# Patient Record
Sex: Male | Born: 1975 | Race: Black or African American | Hispanic: No | Marital: Married | Smoking: Current every day smoker
Health system: Southern US, Community
[De-identification: ages and names within clinical notes are randomized; demographics above are authoritative.]

---

## 2005-02-19 ENCOUNTER — Emergency Department (HOSPITAL_COMMUNITY): Admission: EM | Admit: 2005-02-19 | Discharge: 2005-02-19 | Payer: Self-pay | Admitting: Emergency Medicine

## 2006-07-08 ENCOUNTER — Emergency Department (HOSPITAL_COMMUNITY): Admission: EM | Admit: 2006-07-08 | Discharge: 2006-07-08 | Payer: Self-pay | Admitting: Emergency Medicine

## 2006-07-24 ENCOUNTER — Ambulatory Visit: Payer: Self-pay | Admitting: Internal Medicine

## 2006-07-24 ENCOUNTER — Inpatient Hospital Stay (HOSPITAL_COMMUNITY): Admission: EM | Admit: 2006-07-24 | Discharge: 2006-07-26 | Payer: Self-pay | Admitting: Emergency Medicine

## 2007-01-08 ENCOUNTER — Emergency Department (HOSPITAL_COMMUNITY): Admission: EM | Admit: 2007-01-08 | Discharge: 2007-01-08 | Payer: Self-pay | Admitting: Emergency Medicine

## 2007-07-24 ENCOUNTER — Emergency Department (HOSPITAL_COMMUNITY): Admission: EM | Admit: 2007-07-24 | Discharge: 2007-07-24 | Payer: Self-pay | Admitting: Emergency Medicine

## 2008-05-02 ENCOUNTER — Emergency Department (HOSPITAL_COMMUNITY): Admission: EM | Admit: 2008-05-02 | Discharge: 2008-05-03 | Payer: Self-pay | Admitting: Emergency Medicine

## 2010-01-18 ENCOUNTER — Emergency Department (HOSPITAL_COMMUNITY): Admission: EM | Admit: 2010-01-18 | Discharge: 2010-01-18 | Payer: Self-pay | Admitting: Emergency Medicine

## 2010-06-09 LAB — RPR: RPR Ser Ql: NONREACTIVE

## 2010-07-13 LAB — DIFFERENTIAL
Eosinophils Relative: 1 % (ref 0–5)
Lymphocytes Relative: 25 % (ref 12–46)
Lymphs Abs: 2.7 10*3/uL (ref 0.7–4.0)
Monocytes Absolute: 0.8 10*3/uL (ref 0.1–1.0)

## 2010-07-13 LAB — CBC
HCT: 46.8 % (ref 39.0–52.0)
Hemoglobin: 15.9 g/dL (ref 13.0–17.0)
WBC: 10.9 10*3/uL — ABNORMAL HIGH (ref 4.0–10.5)
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
## 2013-09-24 ENCOUNTER — Emergency Department: Payer: Self-pay | Admitting: Emergency Medicine

## 2014-10-04 ENCOUNTER — Emergency Department
Admission: EM | Admit: 2014-10-04 | Discharge: 2014-10-04 | Disposition: A | Discharge status: Home / Self Care | Payer: Self-pay | Attending: Emergency Medicine | Admitting: Emergency Medicine

## 2014-10-04 ENCOUNTER — Encounter: Payer: Self-pay | Admitting: Emergency Medicine

## 2014-10-04 DIAGNOSIS — K122 Cellulitis and abscess of mouth: Secondary | ICD-10-CM | POA: Insufficient documentation

## 2014-10-04 DIAGNOSIS — Z720 Tobacco use: Secondary | ICD-10-CM | POA: Insufficient documentation

## 2014-10-04 LAB — POCT RAPID STREP A: STREPTOCOCCUS, GROUP A SCREEN (DIRECT): NEGATIVE

## 2014-10-04 MED ORDER — AMOXICILLIN-POT CLAVULANATE 875-125 MG PO TABS
1.0000 | ORAL_TABLET | Freq: Once | ORAL | Status: AC
  Administered 2014-10-04: 1 via ORAL

## 2014-10-04 MED ORDER — AMOXICILLIN-POT CLAVULANATE 875-125 MG PO TABS: 1.0000 | ORAL_TABLET | Freq: Two times a day (BID) | ORAL | Status: AC

## 2014-10-04 MED ORDER — DEXAMETHASONE 4 MG PO TABS
ORAL_TABLET | ORAL | Status: AC
Start: 2014-10-04 — End: 2014-10-04
  Administered 2014-10-04: 8 mg via ORAL
  Filled 2014-10-04: qty 2

## 2014-10-04 MED ORDER — DEXAMETHASONE 4 MG PO TABS
8.0000 mg | ORAL_TABLET | Freq: Once | ORAL | Status: AC
  Administered 2014-10-04: 8 mg via ORAL

## 2014-10-04 MED ORDER — AMOXICILLIN-POT CLAVULANATE 875-125 MG PO TABS
ORAL_TABLET | ORAL | Status: AC
  Administered 2014-10-04: 1 via ORAL
  Filled 2014-10-04: qty 1

## 2014-10-04 NOTE — ED Notes (Signed)
Report to bill, rn.  

## 2014-10-04 NOTE — Discharge Instructions (Signed)
Uvulitis  Uvulitis is redness and soreness (inflammation) of the uvula. The uvula is the small tongue-shaped piece of tissue in the back of your mouth.   CAUSES  Infection is a common cause of uvulitis. Infection of the uvula can be either viral or bacterial. Infectious uvulitis usually only occurs in association with another condition, such as inflammation and infection of the mouth or throat.   Other causes of uvulitis include:  · Trauma to the uvula.  · Swelling from excess fluid buildup (edema), which may be an allergic reaction.  · Inhalation of irritants, such as chemical agents, smoke, or steam.  DIAGNOSIS  Your caregiver can usually diagnose uvulitis through a physical examination. Bacterial uvulitis can be diagnosed through the results of the growth of samples of bodily substances taken from your mouth (cultures).  HOME CARE INSTRUCTIONS   · Rest as much as possible.  · Young children may suck on frozen juice bars or frozen ice pops. Older children and adults may gargle with a warm or cold liquid to help soothe the throat. (Mix ¼ tsp of salt in 8 oz of water, or use strong tea.)  · Use a cool-mist humidifier to lessen throat irritation and cough.  · Drink enough fluids to keep your urine clear or pale yellow.  · While the throat is very sore, eat soft or liquid foods such as milk, ice cream, soups, or milk drinks.  · Family members who develop a sore throat or fever should have a medical exam or throat culture.  · If your child has uvulitis and is taking antibiotic medicine, wait 24 hours or until his or her temperature is near normal (less than 100° F [37.8° C]) before allowing him or her to return to school or day care.  · Only take over-the-counter or prescription medicines for pain, discomfort, or fever as directed by your caregiver.  Ask when your test results will be ready. Make sure you get your test results.   SEEK MEDICAL CARE IF:   · You have an oral temperature above 102° F (38.9° C).  · You  develop large, tender lumps your the neck.  · Your child develops a rash.  · You cough up Mcaulay, yellow-brown, or bloody substances.  SEEK IMMEDIATE MEDICAL CARE IF:   · You develop any new symptoms, such as vomiting, earache, severe headache, stiff neck, chest pain, or trouble breathing or swallowing.  · Your airway is blocked.  · You develop more severe throat pain along with drooling or voice changes.  Document Released: 10/23/2003 Document Revised: 06/06/2011 Document Reviewed: 05/20/2010  Exit[employer]® Patient Information ©2015 [employer]. This information is not intended to replace advice given to you by your health care provider. Make sure you discuss any questions you have with your health care provider.

## 2014-10-04 NOTE — ED Notes (Signed)
Patient states that he woke up with a sore throat this morning.

## 2014-10-04 NOTE — ED Provider Notes (Signed)
[facility] Emergency Department Provider Note  Time seen: 7:10 AM  I have reviewed the triage vital signs and the nursing notes.   HISTORY  Chief Complaint Sore Throat    HPI Thomas Becker is a 39 y.o. male with no past medical history who presents the emergency department with a sore throat. According to the patient his wife states he was snoring very hard last night. The patient awoke this morning with a very sore throat with pain when swallowing. Denies fever, denies shortness of breath. Patient is able to swallow secretions but with pain. Describes his sore throat is moderate in severity worse when swallowing.     No past medical history on file.  There are no active problems to display for this patient.   No past surgical history on file.  No current outpatient prescriptions on file.  Allergies Review of patient's allergies indicates no known allergies.  History reviewed. No pertinent family history.  Social History History  Substance Use Topics  . Smoking status: Current Every Day Smoker -- 0.50 packs/day for 22 years    Types: Cigarettes  . Smokeless tobacco: Not on file  . Alcohol Use: Yes     Comment: occasionly    Review of Systems Constitutional: Negative for fever. Cardiovascular: Negative for chest pain. ENT:  Sore throat Respiratory: Negative for shortness of breath. Gastrointestinal: Negative for abdominal pain Genitourinary: Negative for dysuria. Musculoskeletal: Negative for back pain.  10-point ROS otherwise negative.  ____________________________________________   PHYSICAL EXAM:  VITAL SIGNS: ED Triage Vitals  Enc Vitals Group     BP 10/04/14 0631 132/96 mmHg     Pulse Rate 10/04/14 0631 90     Resp --      Temp 10/04/14 0631 97.8 F (36.6 C)     Temp Source 10/04/14 0631 Oral     SpO2 10/04/14 0631 96 %     Weight 10/04/14 0631 283 lb (128.368 kg)     Height 10/04/14 0631 5\' 9"  (1.753 m)     Head Cir  --      Peak Flow --      Pain Score 10/04/14 0634 9     Pain Loc --      Pain Edu? --      Excl. in GC? --     Constitutional: Alert and oriented. Well appearing and in no distress. Eyes: Normal exam ENT   Mouth/Throat: Mucous membranes are moist. Moderate swelling of the uvula with erythema of the uvula and pharynx, no exudates no unilateral swelling of the tonsils. Cardiovascular: Normal rate, regular rhythm. No murmur Respiratory: Normal respiratory effort without tachypnea nor retractions. Breath sounds are clear and equal bilaterally. No wheezes/rales/rhonchi. Gastrointestinal: Soft and nontender. No distention.  Musculoskeletal: Nontender with normal range of motion in all extremities.  Neurologic:  Normal speech and language. No gross focal neurologic deficits  Skin:  Skin is warm, dry and intact.  Psychiatric: Mood and affect are normal. Speech and behavior are normal.   ____________________________________________   INITIAL IMPRESSION / ASSESSMENT AND PLAN / ED COURSE  Pertinent labs & imaging results that were available during my care of the patient were reviewed by me and considered in my medical decision making (see chart for details).  A with exam consistent with uvulitis. We'll place the patient on Decadron 1, augmentin 7 days. I discussed with the patient use over-the-counter Chloraseptic, and Motrin as needed for discomfort. I discussed strict return precautions with the patient which she  is agreeable. We will discharge the patient home with PCP follow up.  ____________________________________________   FINAL CLINICAL IMPRESSION(S) / ED DIAGNOSES  Uvulitis   Minna Antis, MD 10/04/14 [id number]

## 2014-10-06 LAB — CULTURE, GROUP A STREP (THRC)

## 2016-12-31 ENCOUNTER — Emergency Department
Admission: EM | Admit: 2016-12-31 | Discharge: 2016-12-31 | Disposition: A | Discharge status: Home / Self Care | Payer: No Typology Code available for payment source | Attending: Emergency Medicine | Admitting: Emergency Medicine

## 2016-12-31 ENCOUNTER — Encounter: Payer: Self-pay | Admitting: Emergency Medicine

## 2016-12-31 ENCOUNTER — Emergency Department: Payer: No Typology Code available for payment source

## 2016-12-31 DIAGNOSIS — Y92411 Interstate highway as the place of occurrence of the external cause: Secondary | ICD-10-CM | POA: Insufficient documentation

## 2016-12-31 DIAGNOSIS — Y999 Unspecified external cause status: Secondary | ICD-10-CM | POA: Insufficient documentation

## 2016-12-31 DIAGNOSIS — F17210 Nicotine dependence, cigarettes, uncomplicated: Secondary | ICD-10-CM | POA: Diagnosis not present

## 2016-12-31 DIAGNOSIS — Y9389 Activity, other specified: Secondary | ICD-10-CM | POA: Insufficient documentation

## 2016-12-31 DIAGNOSIS — M542 Cervicalgia: Secondary | ICD-10-CM | POA: Insufficient documentation

## 2016-12-31 DIAGNOSIS — M549 Dorsalgia, unspecified: Secondary | ICD-10-CM | POA: Diagnosis not present

## 2016-12-31 MED ORDER — CYCLOBENZAPRINE HCL 10 MG PO TABS: 10.0000 mg | ORAL_TABLET | Freq: Three times a day (TID) | ORAL | 0 refills | Status: AC | PRN

## 2016-12-31 MED ORDER — KETOROLAC TROMETHAMINE 30 MG/ML IJ SOLN
30.0000 mg | Freq: Once | INTRAMUSCULAR | Status: AC
  Administered 2016-12-31: 30 mg via INTRAMUSCULAR
  Filled 2016-12-31: qty 1

## 2016-12-31 MED ORDER — MELOXICAM 7.5 MG PO TABS: 7.5000 mg | ORAL_TABLET | Freq: Every day | ORAL | 1 refills | Status: AC
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
## 2019-08-01 ENCOUNTER — Other Ambulatory Visit: Payer: Self-pay

## 2019-08-01 ENCOUNTER — Emergency Department
Admission: EM | Admit: 2019-08-01 | Discharge: 2019-08-01 | Disposition: A | Discharge status: Home / Self Care | Payer: Self-pay | Attending: Emergency Medicine | Admitting: Emergency Medicine

## 2019-08-01 ENCOUNTER — Encounter: Payer: Self-pay | Admitting: Emergency Medicine

## 2019-08-01 DIAGNOSIS — F17210 Nicotine dependence, cigarettes, uncomplicated: Secondary | ICD-10-CM | POA: Insufficient documentation

## 2019-08-01 DIAGNOSIS — K047 Periapical abscess without sinus: Secondary | ICD-10-CM | POA: Insufficient documentation

## 2019-08-01 MED ORDER — AMOXICILLIN 500 MG PO CAPS: 500.0000 mg | ORAL_CAPSULE | Freq: Three times a day (TID) | ORAL | 0 refills | Status: AC

## 2019-08-01 MED ORDER — LIDOCAINE VISCOUS HCL 2 % MT SOLN: 10.0000 mL | OROMUCOSAL | 0 refills | Status: AC | PRN

## 2019-08-01 NOTE — Discharge Instructions (Signed)
OPTIONS FOR DENTAL FOLLOW UP CARE ° °[place] [facility] and Human Services - Local Safety Net Dental Clinics °http://www.ncdhhs.gov/dph/oralhealth/services/safetynetclinics.htm °  °[facility] (336-562-3123) ° °[facility] [place] ([phone]) ° °[facility] ([phone] ext [phone]) ° °[facility] ([phone]) ° °[facility] ([phone]) °This clinic caters to the indigent population and is on a lottery system. °Location: °[facility], [address], [place] °Clinic Hours: °Wednesdays from 6pm - 9pm, patients seen by a lottery system. °For dates, call or go to www.med.[facility].edu/[facility]/patients/Dental-[facility] °Services: °Cleanings, fillings and simple extractions. °Payment Options: °DENTAL WORK IS FREE OF CHARGE. Bring proof of income or support. °Best way to get seen: °Arrive at 5:15 pm - this is a lottery, NOT first come/first serve, so arriving earlier will not increase your chances of being seen. °  °  °[facility] °[phone] °Select option 1 for emergencies °  °Location: °[facility], [address], [place] °Clinic Hours: °No walk-ins accepted - call the day before to schedule an appointment. °Check in times are 9:30 am and 1:30 pm. °Services: °Simple extractions, temporary fillings, pulpectomy/pulp debridement, uncomplicated abscess drainage. °Payment Options: °PAYMENT IS DUE AT THE TIME OF SERVICE.  Fee is usually $100-200, additional surgical procedures (e.g. abscess drainage) may be extra. °Cash, checks, Visa/MasterCard accepted.  Can file Medicaid if patient is covered for dental - patient should call case worker to check. °No discount for [facility] patients. °Best way to get seen: °MUST call the day before and get onto the schedule. Can usually be seen the next 1-2 days. No walk-ins accepted. °  °  °[facility] °[phone] °   °Location: °[facility], [address], [place] °Clinic Hours: °M, W, Th, F 8am or 1:30pm, Tues 9a or 1:30 - first come/first served. °Services: °Simple extractions, temporary fillings, uncomplicated abscess drainage.  You do not need to be an [place] resident. °Payment Options: °PAYMENT IS DUE AT THE TIME OF SERVICE. °Dental insurance, otherwise sliding scale - bring proof of income or support. °Depending on income and treatment needed, cost is usually $50-200. °Best way to get seen: °Arrive early as it is first come/first served. °  °  °[facility] °[phone] °  °Location: °[address] °Clinic Hours: °Mon-Thu 8a-5p °Services: °Most basic dental services including extractions and fillings. °Payment Options: °PAYMENT IS DUE AT THE TIME OF SERVICE. °Sliding scale, up to 50% off - bring proof if income or support. °Medicaid with dental option accepted. °Best way to get seen: °Call to schedule an appointment, can usually be seen within 2 weeks OR they will try to see walk-ins - show up at 8a or 2p (you may have to wait). °  °  °[facility] °[phone] °[place] RESIDENTS ONLY °  °Location: °[facility], [address], [place], [place] [address] °Clinic Hours: By appointment only. °Monday - Thursday 8am-5pm, Friday 8am-12pm °Services: Cleanings, fillings, extractions. °Payment Options: °PAYMENT IS DUE AT THE TIME OF SERVICE. °Cash, Visa or MasterCard. Sliding scale - $30 minimum per service. °Best way to get seen: °Come in to office, complete packet and make an appointment - need proof of income °or support monies for each household member and proof of [place] residence. °Usually takes about a month to get in. °  °  °[facility] °[phone] °  °Location: °[address]   [place] °Clinic Hours: Walk-in Urgent Care Dental Services are offered Monday-Friday  mornings only. °The numbers of emergencies accepted daily is limited to the number of °providers available. °Maximum 15 - Mondays, Wednesdays & Thursdays °Maximum 10 - Tuesdays & Fridays °Services: °You do not need to be a [place] resident to be seen for a dental emergency. °Emergencies are defined as pain, swelling, abnormal bleeding, or dental trauma. Walkins will receive x-rays if needed. °NOTE: Dental cleaning is not an emergency. °Payment Options: °PAYMENT IS DUE AT THE TIME OF SERVICE. °Minimum co-pay is $40.00 for uninsured patients. °Minimum co-pay is $3.00 for Medicaid with dental coverage. °Dental Insurance is accepted and must be presented at time of visit. °Medicare does not cover dental. °Forms of payment: Cash, credit card, checks. °Best way to get seen: °If not previously registered with the clinic, walk-in dental registration begins at 7:15 am and is on a first come/first serve basis. °If previously registered with the clinic, call to make an appointment. °  °  °[facility] °[phone] °[place] RESIDENTS ONLY °  °Location: °[address], [place], [place] °Clinic Hours: °Mon-Thu 10a-2p °Services: Extractions only! °Payment Options: °FREE (donations accepted) - bring proof of income or support °Best way to get seen: °Call and schedule an appointment OR come at 8am on the 1st Monday of every month (except for holidays) when it is first come/first served. °  °  °[facility] °[phone] °  °Location: °[address], [place] °Clinic Hours: °Friday mornings °Services, Payment Options, Best way to get seen: °Call for info °

## 2019-08-01 NOTE — ED Triage Notes (Signed)
Pt reports toothache to left lower jaw for the last month getting worse.

## 2019-08-01 NOTE — ED Notes (Signed)
See triage note  Presents with possible dental abscess.. noticed left lower gum swelling about 1 month ago  States he has tried to drain area himself    Now having increased pain

## 2019-08-01 NOTE — ED Provider Notes (Signed)
[facility] Emergency Department Provider Note  ____________________________________________  Time seen: Approximately 9:59 AM  I have reviewed the triage vital signs and the nursing notes.   HISTORY  Chief Complaint Dental Pain    HPI Thomas Becker is a 44 y.o. male that presents to the emergency department for evaluation of left bottom dental pain for 1 month.  Patient states that he has developed an abscess.  It is draining and he is spitting out "salty drainage." He does not have a dentist.  He is unsure of fever.   History reviewed. No pertinent past medical history.  There are no problems to display for this patient.   History reviewed. No pertinent surgical history.  Prior to Admission medications   Medication Sig Start Date End Date Taking? Authorizing Provider  amoxicillin (AMOXIL) 500 MG capsule Take 1 capsule (500 mg total) by mouth 3 (three) times daily. 08/01/19   Enid Derry, PA-C  lidocaine (XYLOCAINE) 2 % solution Use as directed 10 mLs in the mouth or throat as needed. 08/01/19   Enid Derry, PA-C    Allergies Patient has no known allergies.  No family history on file.  Social History Social History   Tobacco Use  . Smoking status: Current Every Day Smoker    Packs/day: 0.50    Years: 22.00    Pack years: 11.00    Types: Cigarettes  Substance Use Topics  . Alcohol use: Yes    Comment: occasionly  . Drug use: No     Review of Systems  Constitutional: No fever/chills Respiratory: No SOB. Gastrointestinal: No nausea, no vomiting.  Musculoskeletal: Negative for musculoskeletal pain. Skin: Negative for rash, abrasions, lacerations, ecchymosis. Neurological: Negative for headaches   ____________________________________________   PHYSICAL EXAM:  VITAL SIGNS: ED Triage Vitals  Enc Vitals Group     BP 08/01/19 0904 (!) 169/122     Pulse Rate 08/01/19 0904 70     Resp 08/01/19 0904 18     Temp 08/01/19 0904 97.8  F (36.6 C)     Temp Source 08/01/19 0904 Oral     SpO2 08/01/19 0904 98 %     Weight 08/01/19 0859 275 lb (124.7 kg)     Height 08/01/19 0859 6\' 3"  (1.905 m)     Head Circumference --      Peak Flow --      Pain Score 08/01/19 0859 10     Pain Loc --      Pain Edu? --      Excl. in GC? --      Constitutional: Alert and oriented. Well appearing and in no acute distress. Eyes: Conjunctivae are normal. PERRL. EOMI. Head: Atraumatic. ENT:      Ears:      Nose: No congestion/rhinnorhea.  Swelling to left bottom gum with active drainage.  Able to open and close mouth without difficulty.  Full range of motion of jaw.      Mouth/Throat: Mucous membranes are moist.  Neck: No stridor.  Cardiovascular: Normal rate, regular rhythm.  Good peripheral circulation. Respiratory: Normal respiratory effort without tachypnea or retractions. Lungs CTAB. Good air entry to the bases with no decreased or absent breath sounds. Musculoskeletal: Full range of motion to all extremities. No gross deformities appreciated. Neurologic:  Normal speech and language. No gross focal neurologic deficits are appreciated.  Skin:  Skin is warm, dry and intact. No rash noted. Psychiatric: Mood and affect are normal. Speech and behavior are normal. Patient exhibits appropriate  insight and judgement.   ____________________________________________   LABS (all labs ordered are listed, but only abnormal results are displayed)  Labs Reviewed - No data to display ____________________________________________  EKG   ____________________________________________  RADIOLOGY  No results found.  ____________________________________________    PROCEDURES  Procedure(s) performed:    Procedures    Medications - No data to display   ____________________________________________   INITIAL IMPRESSION / ASSESSMENT AND PLAN / ED COURSE  Pertinent labs & imaging results that were available during my care of the  patient were reviewed by me and considered in my medical decision making (see chart for details).  Review of the [place] CSRS was performed in accordance of the [place] prior to dispensing any controlled drugs.   Patient's diagnosis is consistent with dental abscess. Patient will be discharged home with prescriptions for amoxicillin and viscous lidocaine. Patient is to follow up with dentist as directed.  Dental resources were given.  Patient is given ED precautions to return to the ED for any worsening or new symptoms.   Thomas Becker was evaluated in Emergency Department on 08/01/2019 for the symptoms described in the history of present illness. He was evaluated in the context of the global COVID-19 pandemic, which necessitated consideration that the patient might be at risk for infection with the SARS-CoV-2 virus that causes COVID-19. Institutional protocols and algorithms that pertain to the evaluation of patients at risk for COVID-19 are in a state of rapid change based on information released by regulatory bodies including the CDC and federal and state organizations. These policies and algorithms were followed during the patient's care in the ED.  ____________________________________________  FINAL CLINICAL IMPRESSION(S) / ED DIAGNOSES  Final diagnoses:  Dental abscess      NEW MEDICATIONS STARTED DURING THIS VISIT:  ED Discharge Orders         Ordered    amoxicillin (AMOXIL) 500 MG capsule  3 times daily     08/01/19 1034    lidocaine (XYLOCAINE) 2 % solution  As needed     08/01/19 1034              This chart was dictated using voice recognition software/Dragon. Despite best efforts to proofread, errors can occur which can change the meaning. Any change was purely unintentional.    Laban Emperor, PA-C 08/01/19 1242    Lavonia Drafts, MD 08/01/19 1252

## 2019-08-17 ENCOUNTER — Other Ambulatory Visit: Payer: Self-pay

## 2019-08-17 ENCOUNTER — Emergency Department
Admission: EM | Admit: 2019-08-17 | Discharge: 2019-08-17 | Disposition: A | Discharge status: Home / Self Care | Payer: Self-pay | Attending: Emergency Medicine | Admitting: Emergency Medicine

## 2019-08-17 DIAGNOSIS — F17210 Nicotine dependence, cigarettes, uncomplicated: Secondary | ICD-10-CM | POA: Insufficient documentation

## 2019-08-17 DIAGNOSIS — K047 Periapical abscess without sinus: Secondary | ICD-10-CM | POA: Insufficient documentation

## 2019-08-17 MED ORDER — TRAMADOL HCL 50 MG PO TABS: 50.0000 mg | ORAL_TABLET | Freq: Four times a day (QID) | ORAL | 0 refills | Status: DC | PRN

## 2019-08-17 MED ORDER — LIDOCAINE-EPINEPHRINE 2 %-1:100000 IJ SOLN
1.7000 mL | Freq: Once | INTRAMUSCULAR | Status: DC
  Filled 2019-08-17: qty 1.7

## 2019-08-17 MED ORDER — HYDROCODONE-ACETAMINOPHEN 5-325 MG PO TABS
1.0000 | ORAL_TABLET | Freq: Once | ORAL | Status: DC
  Filled 2019-08-17: qty 1

## 2019-08-17 MED ORDER — CLINDAMYCIN HCL 150 MG PO CAPS
300.0000 mg | ORAL_CAPSULE | Freq: Once | ORAL | Status: AC
  Administered 2019-08-17: 300 mg via ORAL
  Filled 2019-08-17: qty 2

## 2019-08-17 MED ORDER — CLINDAMYCIN HCL 300 MG PO CAPS: 300.0000 mg | ORAL_CAPSULE | Freq: Four times a day (QID) | ORAL | 0 refills | Status: AC

## 2019-08-17 NOTE — ED Provider Notes (Signed)
[facility] Emergency Department Provider Note  ____________________________________________  Time seen: Approximately 10:37 PM  I have reviewed the triage vital signs and the nursing notes.   HISTORY  Chief Complaint Dental Pain    HPI Thomas Becker is a 44 y.o. male who presents the emergency department complaining of left lower dental pain. Patient been seen earlier this month with similar complaint. Patient states that he was taking the antibiotics, the swelling and pain at almost resolved when he the last antibiotic was taken. Patient states that since then he has had a return of swelling and pain. No fevers or chills. Patient states that the dentist would not touch his tooth until the infection is under control. Patient is here seeking more antibiotic to control the infection.         No past medical history on file.  There are no problems to display for this patient.   No past surgical history on file.  Prior to Admission medications   Medication Sig Start Date End Date Taking? Authorizing Provider  amoxicillin (AMOXIL) 500 MG capsule Take 1 capsule (500 mg total) by mouth 3 (three) times daily. 08/01/19   Laban Emperor, PA-C  clindamycin (CLEOCIN) 300 MG capsule Take 1 capsule (300 mg total) by mouth 4 (four) times daily. 08/17/19   Alera Quevedo, Charline Bills, PA-C  lidocaine (XYLOCAINE) 2 % solution Use as directed 10 mLs in the mouth or throat as needed. 08/01/19   Laban Emperor, PA-C  traMADol (ULTRAM) 50 MG tablet Take 1 tablet (50 mg total) by mouth every 6 (six) hours as needed. 08/17/19   Kennard Fildes, Charline Bills, PA-C    Allergies Patient has no known allergies.  No family history on file.  Social History Social History   Tobacco Use  . Smoking status: Current Every Day Smoker    Packs/day: 0.50    Years: 22.00    Pack years: 11.00    Types: Cigarettes  Substance Use Topics  . Alcohol use: Yes    Comment: occasionly  . Drug use: No      Review of Systems  Constitutional: No fever/chills Eyes: No visual changes. No discharge ENT: Left lower dental infection, return after finishing previous antibiotics Cardiovascular: no chest pain. Respiratory: no cough. No SOB. Gastrointestinal: No abdominal pain.  No nausea, no vomiting.  No diarrhea.  No constipation. Musculoskeletal: Negative for musculoskeletal pain. Skin: Negative for rash, abrasions, lacerations, ecchymosis. Neurological: Negative for headaches, focal weakness or numbness. 10-point ROS otherwise negative.  ____________________________________________   PHYSICAL EXAM:  VITAL SIGNS: ED Triage Vitals  Enc Vitals Group     BP 08/17/19 1934 (!) 163/112     Pulse Rate 08/17/19 1934 87     Resp 08/17/19 1934 18     Temp 08/17/19 1934 98.3 F (36.8 C)     Temp Source 08/17/19 1934 Oral     SpO2 08/17/19 1934 95 %     Weight 08/17/19 1935 270 lb (122.5 kg)     Height 08/17/19 1935 6' (1.829 m)     Head Circumference --      Peak Flow --      Pain Score 08/17/19 1934 10     Pain Loc --      Pain Edu? --      Excl. in [place]? --      Constitutional: Alert and oriented. Well appearing and in no acute distress. Eyes: Conjunctivae are normal. PERRL. EOMI. Head: Atraumatic. ENT:  Ears:       Nose: No congestion/rhinnorhea.      Mouth/Throat: Mucous membranes are moist. Visualization of the dentition reveals overall good and intact dentition. Patient has erythematous, edematous region along the lateral gumline. This was identified around tooth 18 and 19. Palpation of this area was fluctuant. Uvula is midline. No other oropharyngeal erythema or edema identified. Neck: No stridor.   Hematological/Lymphatic/Immunilogical: No cervical lymphadenopathy. Cardiovascular: Normal rate, regular rhythm. Normal S1 and S2.  Good peripheral circulation. Respiratory: Normal respiratory effort without tachypnea or retractions. Lungs CTAB. Good air entry to the bases  with no decreased or absent breath sounds. Musculoskeletal: Full range of motion to all extremities. No gross deformities appreciated. Neurologic:  Normal speech and language. No gross focal neurologic deficits are appreciated.  Skin:  Skin is warm, dry and intact. No rash noted. Psychiatric: Mood and affect are normal. Speech and behavior are normal. Patient exhibits appropriate insight and judgement.   ____________________________________________   LABS (all labs ordered are listed, but only abnormal results are displayed)  Labs Reviewed - No data to display ____________________________________________  EKG   ____________________________________________  RADIOLOGY   No results found.  ____________________________________________    PROCEDURES  Procedure(s) performed:    Dental Block  Date/Time: 08/17/2019 11:10 PM Performed by: Racheal Patches, PA-C Authorized by: Racheal Patches, PA-C   Consent:    Consent obtained:  Verbal   Consent given by:  Patient   Risks discussed:  Pain, swelling and unsuccessful block Indications:    Indications: dental abscess and dental pain   Location:    Block type:  Inferior alveolar   Laterality:  Left Procedure details (see MAR for exact dosages):    Needle gauge:  27 G   Anesthetic injected:  Lidocaine 1% WITH epi   Injection procedure:  Anatomic landmarks identified, introduced needle, negative aspiration for blood and incremental injection Post-procedure details:    Outcome:  Anesthesia achieved   Patient tolerance of procedure:  Tolerated well, no immediate complications .Marland KitchenIncision and Drainage  Date/Time: 08/17/2019 11:11 PM Performed by: Racheal Patches, PA-C Authorized by: Racheal Patches, PA-C   Consent:    Consent obtained:  Verbal   Consent given by:  Patient   Risks discussed:  Bleeding, incomplete drainage and pain Location:    Indications for incision and drainage: dental  abscess.   Location:  Mouth   Mouth location: L lower mandible. Anesthesia (see MAR for exact dosages):    Anesthesia method:  Local infiltration   Local anesthetic:  Lidocaine 1% WITH epi Procedure type:    Complexity:  Simple Procedure details:    Incision types:  Single straight   Incision depth:  Submucosal   Scalpel blade:  11   Wound management:  Probed and deloculated   Drainage:  Bloody and purulent   Drainage amount:  Scant   Packing materials:  None Post-procedure details:    Patient tolerance of procedure:  Tolerated well, no immediate complications      Medications  HYDROcodone-acetaminophen (NORCO/VICODIN) 5-325 MG per tablet 1 tablet (1 tablet Oral Refused 08/17/19 2245)  lidocaine-EPINEPHrine (XYLOCAINE W/EPI) 2 %-1:100000 (with pres) injection 1.7 mL (has no administration in time range)  lidocaine-EPINEPHrine (XYLOCAINE W/EPI) 2 %-1:100000 (with pres) injection 1.7 mL (has no administration in time range)  clindamycin (CLEOCIN) capsule 300 mg (300 mg Oral Given 08/17/19 2245)     ____________________________________________   INITIAL IMPRESSION / ASSESSMENT AND PLAN / ED COURSE  Pertinent labs &  imaging results that were available during my care of the patient were reviewed by me and considered in my medical decision making (see chart for details).  Review of the [place] CSRS was performed in accordance of the [facility] prior to dispensing any controlled drugs.           Patient's diagnosis is consistent with dental abscess. Patient was treated earlier this month with amoxicillin for his dental infection. Patient states that this had almost fully resolved when the antibiotic ran out. Erythema, edema and pain have returned. On evaluation there was fluctuance along the outer gumline consistent with dental abscess. No extension into the submandibular region. Patient was given a dental block, abscess was drained using an 11 blade. Mild amount of purulent material was  expressed. Patient was placed on clindamycin, advised on return precautions as needed. Follow-up with dentist once infection is under control..Patient is given ED precautions to return to the ED for any worsening or new symptoms.     ____________________________________________  FINAL CLINICAL IMPRESSION(S) / ED DIAGNOSES  Final diagnoses:  Dental abscess      NEW MEDICATIONS STARTED DURING THIS VISIT:  ED Discharge Orders         Ordered    clindamycin (CLEOCIN) 300 MG capsule  4 times daily     08/17/19 2306    traMADol (ULTRAM) 50 MG tablet  Every 6 hours PRN     08/17/19 2306              This chart was dictated using voice recognition software/Dragon. Despite best efforts to proofread, errors can occur which can change the meaning. Any change was purely unintentional.    Racheal Patches, PA-C 08/17/19 2313    Phineas Semen, MD 08/17/19 [phone]

## 2019-08-17 NOTE — Discharge Instructions (Signed)
OPTIONS FOR DENTAL FOLLOW UP CARE ° °[place] Department of Health and Human Services - Local Safety Net Dental Clinics °http://www.ncdhhs.gov/dph/oralhealth/services/safetynetclinics.htm °  °[facility] ([phone]) ° °[facility] [place] ([phone]) ° °[facility] ([phone] ext [phone]) ° °[facility] ([phone]) ° °[facility] ([phone]) °This clinic caters to the indigent population and is on a lottery system. °Location: °[facility], [address], [place] °Clinic Hours: °Wednesdays from 6pm - 9pm, patients seen by a lottery system. °For dates, call or go to www.med.[facility].edu/[facility]/patients/Dental-[facility] °Services: °Cleanings, fillings and simple extractions. °Payment Options: °DENTAL WORK IS FREE OF CHARGE. Bring proof of income or support. °Best way to get seen: °Arrive at 5:15 pm - this is a lottery, NOT first come/first serve, so arriving earlier will not increase your chances of being seen. °  °  °[facility] °[phone] °Select option 1 for emergencies °  °Location: °[facility], [address], [place] °Clinic Hours: °No walk-ins accepted - call the day before to schedule an appointment. °Check in times are 9:30 am and 1:30 pm. °Services: °Simple extractions, temporary fillings, pulpectomy/pulp debridement, uncomplicated abscess drainage. °Payment Options: °PAYMENT IS DUE AT THE TIME OF SERVICE.  Fee is usually $100-200, additional surgical procedures (e.g. abscess drainage) may be extra. °Cash, checks, Visa/MasterCard accepted.  Can file Medicaid if patient is covered for dental - patient should call case worker to check. °No discount for [facility] patients. °Best way to get seen: °MUST call the day before and get onto the schedule. Can usually be seen the next 1-2 days. No walk-ins accepted. °  °  °[facility] °[phone] °   °Location: °[facility], [address], [place] °Clinic Hours: °M, W, Th, F 8am or 1:30pm, Tues 9a or 1:30 - first come/first served. °Services: °Simple extractions, temporary fillings, uncomplicated abscess drainage.  You do not need to be an [place] resident. °Payment Options: °PAYMENT IS DUE AT THE TIME OF SERVICE. °Dental insurance, otherwise sliding scale - bring proof of income or support. °Depending on income and treatment needed, cost is usually $50-200. °Best way to get seen: °Arrive early as it is first come/first served. °  °  °[facility] °[phone] °  °Location: °[address] °Clinic Hours: °Mon-Thu 8a-5p °Services: °Most basic dental services including extractions and fillings. °Payment Options: °PAYMENT IS DUE AT THE TIME OF SERVICE. °Sliding scale, up to 50% off - bring proof if income or support. °Medicaid with dental option accepted. °Best way to get seen: °Call to schedule an appointment, can usually be seen within 2 weeks OR they will try to see walk-ins - show up at 8a or 2p (you may have to wait). °  °  °[facility] °[phone] °[place] RESIDENTS ONLY °  °Location: °[facility], [address], [place], [place] [address] °Clinic Hours: By appointment only. °Monday - Thursday 8am-5pm, Friday 8am-12pm °Services: Cleanings, fillings, extractions. °Payment Options: °PAYMENT IS DUE AT THE TIME OF SERVICE. °Cash, Visa or MasterCard. Sliding scale - $30 minimum per service. °Best way to get seen: °Come in to office, complete packet and make an appointment - need proof of income °or support monies for each household member and proof of [place] residence. °Usually takes about a month to get in. °  °  °[facility] °[phone] °  °Location: °[address]   [place] °Clinic Hours: Walk-in Urgent Care Dental Services are offered Monday-Friday  mornings only. °The numbers of emergencies accepted daily is limited to the number of °providers available. °Maximum 15 - Mondays, Wednesdays & Thursdays °Maximum 10 - Tuesdays & Fridays °Services: °You do not need to be a [place] resident to be seen for a dental emergency. °Emergencies are defined as pain, swelling, abnormal bleeding, or dental trauma. Walkins will receive x-rays if needed. °NOTE: Dental cleaning is not an emergency. °Payment Options: °PAYMENT IS DUE AT THE TIME OF SERVICE. °Minimum co-pay is $40.00 for uninsured patients. °Minimum co-pay is $3.00 for Medicaid with dental coverage. °Dental Insurance is accepted and must be presented at time of visit. °Medicare does not cover dental. °Forms of payment: Cash, credit card, checks. °Best way to get seen: °If not previously registered with the clinic, walk-in dental registration begins at 7:15 am and is on a first come/first serve basis. °If previously registered with the clinic, call to make an appointment. °  °  °[facility] °[phone] °[place] RESIDENTS ONLY °  °Location: °[address], [place], [place] °Clinic Hours: °Mon-Thu 10a-2p °Services: Extractions only! °Payment Options: °FREE (donations accepted) - bring proof of income or support °Best way to get seen: °Call and schedule an appointment OR come at 8am on the 1st Monday of every month (except for holidays) when it is first come/first served. °  °  °[facility] °[phone] °  °Location: °[address], [place] °Clinic Hours: °Friday mornings °Services, Payment Options, Best way to get seen: °Call for info °

## 2019-08-17 NOTE — ED Notes (Signed)
See triage note- pt here with abscess to left side lower jaw. Pt has completed course of amoxicillin with no improvment in symptoms. Pt states it drains puss when he pushes on it. Site is swollen.

## 2019-08-17 NOTE — ED Triage Notes (Signed)
Patient reports continued dental pain to left lower jaw.  Seen for same at beginning of May.

## 2021-07-01 ENCOUNTER — Emergency Department: Payer: Self-pay

## 2021-07-01 ENCOUNTER — Encounter: Payer: Self-pay | Admitting: Emergency Medicine

## 2021-07-01 ENCOUNTER — Emergency Department
Admission: EM | Admit: 2021-07-01 | Discharge: 2021-07-01 | Disposition: A | Discharge status: Home / Self Care | Payer: Self-pay | Attending: Emergency Medicine | Admitting: Emergency Medicine

## 2021-07-01 DIAGNOSIS — M25521 Pain in right elbow: Secondary | ICD-10-CM | POA: Diagnosis not present

## 2021-07-01 DIAGNOSIS — Y92410 Unspecified street and highway as the place of occurrence of the external cause: Secondary | ICD-10-CM | POA: Diagnosis not present

## 2021-07-01 DIAGNOSIS — M542 Cervicalgia: Secondary | ICD-10-CM | POA: Insufficient documentation

## 2021-07-01 DIAGNOSIS — R519 Headache, unspecified: Secondary | ICD-10-CM | POA: Diagnosis present

## 2021-07-01 MED ORDER — IBUPROFEN 400 MG PO TABS
400.0000 mg | ORAL_TABLET | Freq: Once | ORAL | Status: AC
  Administered 2021-07-01: 400 mg via ORAL
  Filled 2021-07-01: qty 1

## 2021-07-01 MED ORDER — CYCLOBENZAPRINE HCL 10 MG PO TABS: 10.0000 mg | ORAL_TABLET | Freq: Three times a day (TID) | ORAL | 0 refills | Status: AC | PRN

## 2021-07-01 MED ORDER — ACETAMINOPHEN 500 MG PO TABS
1000.0000 mg | ORAL_TABLET | Freq: Once | ORAL | Status: AC
  Administered 2021-07-01: 1000 mg via ORAL
  Filled 2021-07-01: qty 2

## 2021-07-01 MED ORDER — OXYCODONE-ACETAMINOPHEN 5-325 MG PO TABS: 1.0000 | ORAL_TABLET | Freq: Four times a day (QID) | ORAL | 0 refills | Status: AC | PRN
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
## 2021-07-01 NOTE — Discharge Instructions (Addendum)
-  Please establish with a primary care provider [facility] clinic, [facility], etc.)  and follow-up with the incidental findings on your CT scan. ?-Rest and ice your right elbow.  Recommend ace bandage for compression for 1 week. ?-Follow-up with your dentist, as discussed. ?-Return to the emergency department anytime if you begin to experience any new or worsening symptoms ?

## 2021-07-01 NOTE — ED Notes (Signed)
Applied ACE bandage to right elbow and educated patient on figure of eight technique to provide support and compression to the affected joint. Pt verbalized understanding.  ?
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
